# Patient Record
Sex: Female | Born: 1975 | Race: White | Hispanic: No | Smoking: Current every day smoker
Health system: Southern US, Community
[De-identification: ages and names within clinical notes are randomized; demographics above are authoritative.]

## PROBLEM LIST (undated history)

## (undated) HISTORY — PX: TONSILLECTOMY: SUR1361

---

## 2013-04-14 ENCOUNTER — Emergency Department (HOSPITAL_BASED_OUTPATIENT_CLINIC_OR_DEPARTMENT_OTHER)
Admission: EM | Admit: 2013-04-14 | Discharge: 2013-04-14 | Disposition: A | Discharge status: Home / Self Care | Payer: Self-pay | Attending: Emergency Medicine | Admitting: Emergency Medicine

## 2013-04-14 ENCOUNTER — Encounter (HOSPITAL_BASED_OUTPATIENT_CLINIC_OR_DEPARTMENT_OTHER): Payer: Self-pay | Admitting: *Deleted

## 2013-04-14 ENCOUNTER — Emergency Department (HOSPITAL_BASED_OUTPATIENT_CLINIC_OR_DEPARTMENT_OTHER): Payer: Self-pay

## 2013-04-14 DIAGNOSIS — F17200 Nicotine dependence, unspecified, uncomplicated: Secondary | ICD-10-CM | POA: Insufficient documentation

## 2013-04-14 DIAGNOSIS — Y92410 Unspecified street and highway as the place of occurrence of the external cause: Secondary | ICD-10-CM | POA: Insufficient documentation

## 2013-04-14 DIAGNOSIS — S0003XA Contusion of scalp, initial encounter: Secondary | ICD-10-CM | POA: Insufficient documentation

## 2013-04-14 DIAGNOSIS — S0510XA Contusion of eyeball and orbital tissues, unspecified eye, initial encounter: Secondary | ICD-10-CM | POA: Insufficient documentation

## 2013-04-14 DIAGNOSIS — S0993XA Unspecified injury of face, initial encounter: Secondary | ICD-10-CM

## 2013-04-14 DIAGNOSIS — S01511A Laceration without foreign body of lip, initial encounter: Secondary | ICD-10-CM

## 2013-04-14 DIAGNOSIS — S01501A Unspecified open wound of lip, initial encounter: Secondary | ICD-10-CM | POA: Insufficient documentation

## 2013-04-14 DIAGNOSIS — T07XXXA Unspecified multiple injuries, initial encounter: Secondary | ICD-10-CM

## 2013-04-14 DIAGNOSIS — Y9389 Activity, other specified: Secondary | ICD-10-CM | POA: Insufficient documentation

## 2013-04-14 DIAGNOSIS — Z23 Encounter for immunization: Secondary | ICD-10-CM | POA: Insufficient documentation

## 2013-04-14 DIAGNOSIS — S025XXA Fracture of tooth (traumatic), initial encounter for closed fracture: Secondary | ICD-10-CM | POA: Insufficient documentation

## 2013-04-14 MED ORDER — CEPHALEXIN 500 MG PO CAPS: 500.0000 mg | ORAL_CAPSULE | Freq: Four times a day (QID) | ORAL | Status: DC

## 2013-04-14 MED ORDER — HYDROCODONE-ACETAMINOPHEN 5-325 MG PO TABS
2.0000 | ORAL_TABLET | Freq: Once | ORAL | Status: AC
  Administered 2013-04-14: 2 via ORAL
  Filled 2013-04-14: qty 2

## 2013-04-14 MED ORDER — HYDROCODONE-ACETAMINOPHEN 5-325 MG PO TABS: 2.0000 | ORAL_TABLET | ORAL | Status: DC | PRN

## 2013-04-14 MED ORDER — TETANUS-DIPHTH-ACELL PERTUSSIS 5-2.5-18.5 LF-MCG/0.5 IM SUSP
0.5000 mL | Freq: Once | INTRAMUSCULAR | Status: AC
  Administered 2013-04-14: 0.5 mL via INTRAMUSCULAR
  Filled 2013-04-14: qty 0.5
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
## 2013-04-14 NOTE — ED Provider Notes (Signed)
Medical screening examination/treatment/procedure(s) were performed by non-physician practitioner and as supervising physician I was immediately available for consultation/collaboration.   Rogen Porte, MD 04/14/13 2318 
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
## 2013-04-19 ENCOUNTER — Encounter (HOSPITAL_BASED_OUTPATIENT_CLINIC_OR_DEPARTMENT_OTHER): Payer: Self-pay | Admitting: *Deleted

## 2013-04-19 ENCOUNTER — Emergency Department (HOSPITAL_BASED_OUTPATIENT_CLINIC_OR_DEPARTMENT_OTHER)
Admission: EM | Admit: 2013-04-19 | Discharge: 2013-04-19 | Disposition: A | Discharge status: Home / Self Care | Payer: Self-pay | Attending: Emergency Medicine | Admitting: Emergency Medicine

## 2013-04-19 DIAGNOSIS — Z87828 Personal history of other (healed) physical injury and trauma: Secondary | ICD-10-CM | POA: Insufficient documentation

## 2013-04-19 DIAGNOSIS — F0781 Postconcussional syndrome: Secondary | ICD-10-CM | POA: Insufficient documentation

## 2013-04-19 DIAGNOSIS — R110 Nausea: Secondary | ICD-10-CM | POA: Insufficient documentation

## 2013-04-19 DIAGNOSIS — F17200 Nicotine dependence, unspecified, uncomplicated: Secondary | ICD-10-CM | POA: Insufficient documentation

## 2013-04-19 MED ORDER — CYCLOBENZAPRINE HCL 10 MG PO TABS: 10.0000 mg | ORAL_TABLET | Freq: Three times a day (TID) | ORAL | Status: AC | PRN

## 2013-04-19 MED ORDER — OXYCODONE-ACETAMINOPHEN 5-325 MG PO TABS: 1.0000 | ORAL_TABLET | Freq: Four times a day (QID) | ORAL | Status: DC | PRN
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
## 2013-07-29 ENCOUNTER — Encounter (HOSPITAL_BASED_OUTPATIENT_CLINIC_OR_DEPARTMENT_OTHER): Payer: Self-pay | Admitting: Emergency Medicine

## 2013-07-29 ENCOUNTER — Emergency Department (HOSPITAL_BASED_OUTPATIENT_CLINIC_OR_DEPARTMENT_OTHER)
Admission: EM | Admit: 2013-07-29 | Discharge: 2013-07-29 | Disposition: A | Discharge status: Home / Self Care | Payer: No Typology Code available for payment source | Attending: Emergency Medicine | Admitting: Emergency Medicine

## 2013-07-29 DIAGNOSIS — Y9289 Other specified places as the place of occurrence of the external cause: Secondary | ICD-10-CM | POA: Insufficient documentation

## 2013-07-29 DIAGNOSIS — IMO0002 Reserved for concepts with insufficient information to code with codable children: Secondary | ICD-10-CM | POA: Insufficient documentation

## 2013-07-29 DIAGNOSIS — Y9389 Activity, other specified: Secondary | ICD-10-CM | POA: Insufficient documentation

## 2013-07-29 DIAGNOSIS — S0993XA Unspecified injury of face, initial encounter: Secondary | ICD-10-CM | POA: Insufficient documentation

## 2013-07-29 DIAGNOSIS — R51 Headache: Secondary | ICD-10-CM | POA: Insufficient documentation

## 2013-07-29 DIAGNOSIS — R05 Cough: Secondary | ICD-10-CM | POA: Insufficient documentation

## 2013-07-29 DIAGNOSIS — Z79899 Other long term (current) drug therapy: Secondary | ICD-10-CM | POA: Insufficient documentation

## 2013-07-29 DIAGNOSIS — X503XXA Overexertion from repetitive movements, initial encounter: Secondary | ICD-10-CM | POA: Insufficient documentation

## 2013-07-29 DIAGNOSIS — Z792 Long term (current) use of antibiotics: Secondary | ICD-10-CM | POA: Insufficient documentation

## 2013-07-29 DIAGNOSIS — R059 Cough, unspecified: Secondary | ICD-10-CM | POA: Insufficient documentation

## 2013-07-29 DIAGNOSIS — F17200 Nicotine dependence, unspecified, uncomplicated: Secondary | ICD-10-CM | POA: Insufficient documentation

## 2013-07-29 DIAGNOSIS — R110 Nausea: Secondary | ICD-10-CM | POA: Insufficient documentation

## 2013-07-29 DIAGNOSIS — S46911A Strain of unspecified muscle, fascia and tendon at shoulder and upper arm level, right arm, initial encounter: Secondary | ICD-10-CM

## 2013-07-29 MED ORDER — OXYCODONE-ACETAMINOPHEN 5-325 MG PO TABS: 1.0000 | ORAL_TABLET | Freq: Four times a day (QID) | ORAL | Status: AC | PRN

## 2013-07-29 MED ORDER — OXYCODONE-ACETAMINOPHEN 5-325 MG PO TABS
1.0000 | ORAL_TABLET | Freq: Once | ORAL | Status: AC
  Administered 2013-07-29: 1 via ORAL
  Filled 2013-07-29: qty 1

## 2013-07-29 NOTE — ED Notes (Signed)
Pt sts moved a few months ago and has been going through boxes, woke up the next day with right arm pain radiating up into neck and causing head pain as well.

## 2013-07-29 NOTE — ED Provider Notes (Addendum)
CSN: [id number]     Arrival date & time 07/29/13  1704 History  This chart was scribed for Junius Argyle, MD by Bennett Scrape, ED Scribe. This patient was seen in room MHT13/[facility]13 and the patient's care was started at 7:17 PM.    Chief Complaint  Patient presents with  . Shoulder Pain    Patient is a 37 y.o. female presenting with shoulder pain. The history is provided by the patient. No language interpreter was used.  Shoulder Pain This is a new problem. The current episode started more than 2 days ago. The problem occurs constantly. The problem has not changed since onset.Associated symptoms include headaches. Pertinent negatives include no chest pain, no abdominal pain and no shortness of breath. Exacerbated by: lifting the arm and turning the head. Relieved by: protraction and message  Treatments tried: 200 mg ibuprofen. The treatment provided mild relief.   HPI Comments: Katelyn Johnson is a 37 y.o. female who presents to the Emergency Department complaining of 3 days of right arm pain that radiates into the right neck with associated intermittent HA upon waking. She describes the pain as a pulling sensation mainly located around the right shoulder blade. She admits that she moved a few months ago and has been going through boxes the day prior to the onset. She states that the pain is improved mildly with message and protraction and is aggravated with turning the head and with lifting of the right arm.    No past medical history on file. Past Surgical History  Procedure Laterality Date  . Tonsillectomy     No family history on file. History  Substance Use Topics  . Smoking status: Current Every Day Smoker -- 1.00 packs/day    Types: Cigarettes  . Smokeless tobacco: Not on file  . Alcohol Use: Yes     Comment: once in a while  She is a waitress  No OB history provided.   Review of Systems  Constitutional: Negative for fever and fatigue.  HENT: Negative for congestion and  drooling.   Eyes: Negative for pain.  Respiratory: Positive for cough. Negative for shortness of breath.   Cardiovascular: Negative for chest pain.  Gastrointestinal: Positive for nausea. Negative for vomiting, abdominal pain and diarrhea.  Genitourinary: Negative for dysuria and hematuria.  Musculoskeletal: Positive for arthralgias and neck pain. Negative for back pain.  Skin: Negative for color change.  Neurological: Positive for headaches. Negative for dizziness.  Hematological: Negative for adenopathy.  Psychiatric/Behavioral: Negative for behavioral problems.  All other systems reviewed and are negative.    Allergies  Review of patient's allergies indicates no known allergies.  Home Medications   Current Outpatient Rx  Name  Route  Sig  Dispense  Refill  . cephALEXin (KEFLEX) 500 MG capsule   Oral   Take 1 capsule (500 mg total) by mouth 4 (four) times daily.   28 capsule   0   . cyclobenzaprine (FLEXERIL) 10 MG tablet   Oral   Take 1 tablet (10 mg total) by mouth 3 (three) times daily as needed for muscle spasms.   30 tablet   0   . oxyCODONE-acetaminophen (PERCOCET/ROXICET) 5-325 MG per tablet   Oral   Take 1-2 tablets by mouth every 6 (six) hours as needed for pain.   20 tablet   0    Triage Vitals: BP 132/89  Pulse 90  Temp(Src) 99 F (37.2 C) (Oral)  Wt 100 lb (45.36 kg)  BMI 17.72 kg/m2  SpO2 100%  LMP 07/24/2013  Physical Exam  Nursing note and vitals reviewed. Constitutional: She is oriented to person, place, and time. She appears well-developed and well-nourished. No distress.  HENT:  Head: Normocephalic and atraumatic.  Mouth/Throat: Oropharynx is clear and moist.  Eyes: EOM are normal.  Neck: Neck supple. No tracheal deviation present.  Mild TTP of right trapezius. Mild pain with ROM of neck to the left.   Cardiovascular: Normal rate, regular rhythm, normal heart sounds and intact distal pulses.   Pulmonary/Chest: Effort normal. No  respiratory distress.  Musculoskeletal: Normal range of motion.  Normal ROM of right arm. Pain with abduction of right shoulder. Normal sensation. 2+ distal pulses   Neurological: She is alert and oriented to person, place, and time.  Skin: Skin is warm and dry.  Psychiatric: She has a normal mood and affect. Her behavior is normal.    ED Course  Procedures (including critical care time)  DIAGNOSTIC STUDIES: Oxygen Saturation is 100% on room air, normal by my interpretation.    COORDINATION OF CARE: 7:21 PM- Discussed discharge plan which includes ice, continued Ibuprofen, heating pad and Percocet PRN with pt and pt agreed to plan. Also advised pt to follow up as needed and pt agreed. Addressed symptoms to return for with pt. Will provide a work note for tonight with instructions for no heavy lifting.   Labs Review Labs Reviewed - No data to display Imaging Review No results found.  EKG Interpretation   None       MDM   1. Right shoulder strain, initial encounter    7:27 PM 37 y.o. female who presents with right shoulder pain for several days after lifting boxes. Her exam is consistent with a right shoulder strain. Will recommend rest,NSAIDs, and will provide a prescription for stronger pain medicine for several days.   Discharge Medication List as of 07/29/2013  7:25 PM    START taking these medications   Details  !! oxyCODONE-acetaminophen (PERCOCET) 5-325 MG per tablet Take 1 tablet by mouth every 6 (six) hours as needed for pain., Starting 07/29/2013, Until Discontinued, Print     !! - Potential duplicate medications found. Please discuss with provider.        I personally performed the services described in this documentation, which was scribed in my presence. The recorded information has been reviewed and is accurate.    Junius Argyle, MD 07/30/13 1037  Junius Argyle, MD 07/30/13 1037
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
## 2014-01-05 ENCOUNTER — Encounter (HOSPITAL_BASED_OUTPATIENT_CLINIC_OR_DEPARTMENT_OTHER): Payer: Self-pay | Admitting: Emergency Medicine

## 2014-01-05 ENCOUNTER — Emergency Department (HOSPITAL_BASED_OUTPATIENT_CLINIC_OR_DEPARTMENT_OTHER)
Admission: EM | Admit: 2014-01-05 | Discharge: 2014-01-05 | Disposition: A | Discharge status: Home / Self Care | Payer: No Typology Code available for payment source | Attending: Emergency Medicine | Admitting: Emergency Medicine

## 2014-01-05 DIAGNOSIS — J209 Acute bronchitis, unspecified: Secondary | ICD-10-CM | POA: Insufficient documentation

## 2014-01-05 DIAGNOSIS — J40 Bronchitis, not specified as acute or chronic: Secondary | ICD-10-CM

## 2014-01-05 DIAGNOSIS — H9209 Otalgia, unspecified ear: Secondary | ICD-10-CM | POA: Insufficient documentation

## 2014-01-05 DIAGNOSIS — F17200 Nicotine dependence, unspecified, uncomplicated: Secondary | ICD-10-CM | POA: Insufficient documentation

## 2014-01-05 MED ORDER — BENZONATATE 200 MG PO CAPS: 200.0000 mg | ORAL_CAPSULE | Freq: Three times a day (TID) | ORAL | Status: AC | PRN

## 2014-01-05 MED ORDER — AMOXICILLIN 500 MG PO CAPS: 1000.0000 mg | ORAL_CAPSULE | Freq: Two times a day (BID) | ORAL | Status: AC

## 2014-01-05 NOTE — Discharge Instructions (Signed)
Bronchitis Bronchitis is inflammation of the airways that extend from the windpipe into the lungs (bronchi). The inflammation often causes mucus to develop, which leads to a cough. If the inflammation becomes severe, it may cause shortness of breath. CAUSES  Bronchitis may be caused by:   Viral infections.   Bacteria.   Cigarette smoke.   Allergens, pollutants, and other irritants.  SIGNS AND SYMPTOMS  The most common symptom of bronchitis is a frequent cough that produces mucus. Other symptoms include:  Fever.   Body aches.   Chest congestion.   Chills.   Shortness of breath.   Sore throat.  DIAGNOSIS  Bronchitis is usually diagnosed through a medical history and physical exam. Tests, such as chest X-rays, are sometimes done to rule out other conditions.  TREATMENT  You may need to avoid contact with whatever caused the problem (smoking, for example). Medicines are sometimes needed. These may include:  Antibiotics. These may be prescribed if the condition is caused by bacteria.  Cough suppressants. These may be prescribed for relief of cough symptoms.   Inhaled medicines. These may be prescribed to help open your airways and make it easier for you to breathe.   Steroid medicines. These may be prescribed for those with recurrent (chronic) bronchitis. HOME CARE INSTRUCTIONS  Get plenty of rest.   Drink enough fluids to keep your urine clear or pale yellow (unless you have a medical condition that requires fluid restriction). Increasing fluids may help thin your secretions and will prevent dehydration.   Only take over-the-counter or prescription medicines as directed by your health care provider.  Only take antibiotics as directed. Make sure you finish them even if you start to feel better.  Avoid secondhand smoke, irritating chemicals, and strong fumes. These will make bronchitis worse. If you are a smoker, quit smoking. Consider using nicotine gum or  skin patches to help control withdrawal symptoms. Quitting smoking will help your lungs heal faster.   Put a cool-mist humidifier in your bedroom at night to moisten the air. This may help loosen mucus. Change the water in the humidifier daily. You can also run the hot water in your shower and sit in the bathroom with the door closed for 5 10 minutes.   Follow up with your health care provider as directed.   Wash your hands frequently to avoid catching bronchitis again or spreading an infection to others.  SEEK MEDICAL CARE IF: Your symptoms do not improve after 1 week of treatment.  SEEK IMMEDIATE MEDICAL CARE IF:  Your fever increases.  You have chills.   You have chest pain.   You have worsening shortness of breath.   You have bloody sputum.  You faint.  You have lightheadedness.  You have a severe headache.   You vomit repeatedly. MAKE SURE YOU:   Understand these instructions.  Will watch your condition.  Will get help right away if you are not doing well or get worse. Document Released: 09/14/2005 Document Revised: 07/05/2013 Document Reviewed: 05/09/2013 Exit[employer] Patient Information 2014 [employer]. Smoking Cessation Quitting smoking is important to your health and has many advantages. However, it is not always easy to quit since nicotine is a very addictive drug. Often times, people try 3 times or more before being able to quit. This document explains the best ways for you to prepare to quit smoking. Quitting takes hard work and a lot of effort, but you can do it. ADVANTAGES OF QUITTING SMOKING  You will live longer,   feel better, and live better.  Your body will feel the impact of quitting smoking almost immediately.  Within 20 minutes, blood pressure decreases. Your pulse returns to its normal level.  After 8 hours, carbon monoxide levels in the blood return to normal. Your oxygen level increases.  After 24 hours, the chance of having a heart  attack starts to decrease. Your breath, hair, and body stop smelling like smoke.  After 48 hours, damaged nerve endings begin to recover. Your sense of taste and smell improve.  After 72 hours, the body is virtually free of nicotine. Your bronchial tubes relax and breathing becomes easier.  After 2 to 12 weeks, lungs can hold more air. Exercise becomes easier and circulation improves.  The risk of having a heart attack, stroke, cancer, or lung disease is greatly reduced.  After 1 year, the risk of coronary heart disease is cut in half.  After 5 years, the risk of stroke falls to the same as a nonsmoker.  After 10 years, the risk of lung cancer is cut in half and the risk of other cancers decreases significantly.  After 15 years, the risk of coronary heart disease drops, usually to the level of a nonsmoker.  If you are pregnant, quitting smoking will improve your chances of having a healthy baby.  The people you live with, especially any children, will be healthier.  You will have extra money to spend on things other than cigarettes. QUESTIONS TO THINK ABOUT BEFORE ATTEMPTING TO QUIT You may want to talk about your answers with your caregiver.  Why do you want to quit?  If you tried to quit in the past, what helped and what did not?  What will be the most difficult situations for you after you quit? How will you plan to handle them?  Who can help you through the tough times? Your family? Friends? A caregiver?  What pleasures do you get from smoking? What ways can you still get pleasure if you quit? Here are some questions to ask your caregiver:  How can you help me to be successful at quitting?  What medicine do you think would be best for me and how should I take it?  What should I do if I need more help?  What is smoking withdrawal like? How can I get information on withdrawal? GET READY  Set a quit date.  Change your environment by getting rid of all cigarettes,  ashtrays, matches, and lighters in your home, car, or work. Do not let people smoke in your home.  Review your past attempts to quit. Think about what worked and what did not. GET SUPPORT AND ENCOURAGEMENT You have a better chance of being successful if you have help. You can get support in many ways.  Tell your family, friends, and co-workers that you are going to quit and need their support. Ask them not to smoke around you.  Get individual, group, or telephone counseling and support. Programs are available at local hospitals and health centers. Call your local health department for information about programs in your area.  Spiritual beliefs and practices may help some smokers quit.  Download a "quit meter" on your computer to keep track of quit statistics, such as how long you have gone without smoking, cigarettes not smoked, and money saved.  Get a self-help book about quitting smoking and staying off of tobacco. LEARN NEW SKILLS AND BEHAVIORS  Distract yourself from urges to smoke. Talk to someone, go for a   walk, or occupy your time with a task.  Change your normal routine. Take a different route to work. Drink tea instead of coffee. Eat breakfast in a different place.  Reduce your stress. Take a hot bath, exercise, or read a book.  Plan something enjoyable to do every day. Reward yourself for not smoking.  Explore interactive web-based programs that specialize in helping you quit. GET MEDICINE AND USE IT CORRECTLY Medicines can help you stop smoking and decrease the urge to smoke. Combining medicine with the above behavioral methods and support can greatly increase your chances of successfully quitting smoking.  Nicotine replacement therapy helps deliver nicotine to your body without the negative effects and risks of smoking. Nicotine replacement therapy includes nicotine gum, lozenges, inhalers, nasal sprays, and skin patches. Some may be available over-the-counter and others  require a prescription.  Antidepressant medicine helps people abstain from smoking, but how this works is unknown. This medicine is available by prescription.  Nicotinic receptor partial agonist medicine simulates the effect of nicotine in your brain. This medicine is available by prescription. Ask your caregiver for advice about which medicines to use and how to use them based on your health history. Your caregiver will tell you what side effects to look out for if you choose to be on a medicine or therapy. Carefully read the information on the package. Do not use any other product containing nicotine while using a nicotine replacement product.  RELAPSE OR DIFFICULT SITUATIONS Most relapses occur within the first 3 months after quitting. Do not be discouraged if you start smoking again. Remember, most people try several times before finally quitting. You may have symptoms of withdrawal because your body is used to nicotine. You may crave cigarettes, be irritable, feel very hungry, cough often, get headaches, or have difficulty concentrating. The withdrawal symptoms are only temporary. They are strongest when you first quit, but they will go away within 10 14 days. To reduce the chances of relapse, try to:  Avoid drinking alcohol. Drinking lowers your chances of successfully quitting.  Reduce the amount of caffeine you consume. Once you quit smoking, the amount of caffeine in your body increases and can give you symptoms, such as a rapid heartbeat, sweating, and anxiety.  Avoid smokers because they can make you want to smoke.  Do not let weight gain distract you. Many smokers will gain weight when they quit, usually less than 10 pounds. Eat a healthy diet and stay active. You can always lose the weight gained after you quit.  Find ways to improve your mood other than smoking. FOR MORE INFORMATION  www.smokefree.gov  Document Released: 09/08/2001 Document Revised: 03/15/2012 Document Reviewed:  12/24/2011 Exit[employer] Patient Information 2014 [employer].  

## 2014-01-05 NOTE — ED Provider Notes (Signed)
CSN: [id number]     Arrival date & time 01/05/14  1327 History   First MD Initiated Contact with Patient 01/05/14 1431     Chief Complaint  Patient presents with  . URI     (Consider location/radiation/quality/duration/timing/severity/associated sxs/prior Treatment) Patient is a 38 y.o. female presenting with URI. The history is provided by the patient. No language interpreter was used.  URI Presenting symptoms: cough, ear pain, facial pain, rhinorrhea and sore throat   Presenting symptoms: no fever   Severity:  Moderate Associated symptoms comment:  Cough for the past 3 weeks, left sore throat for the past 3 days. No known fever. She reports increasing congestion, muffled hearing in left ear.    History reviewed. No pertinent past medical history. Past Surgical History  Procedure Laterality Date  . Tonsillectomy     No family history on file. History  Substance Use Topics  . Smoking status: Current Every Day Smoker -- 1.00 packs/day    Types: Cigarettes  . Smokeless tobacco: Not on file  . Alcohol Use: Yes     Comment: once in a while   OB History   Grav Para Term Preterm Abortions TAB SAB Ect Mult Living                 Review of Systems  Constitutional: Negative for fever.  HENT: Positive for ear pain, rhinorrhea and sore throat.   Respiratory: Positive for cough.   Gastrointestinal: Negative for nausea.      Allergies  Review of patient's allergies indicates no known allergies.  Home Medications   Current Outpatient Rx  Name  Route  Sig  Dispense  Refill  . cephALEXin (KEFLEX) 500 MG capsule   Oral   Take 1 capsule (500 mg total) by mouth 4 (four) times daily.   28 capsule   0   . cyclobenzaprine (FLEXERIL) 10 MG tablet   Oral   Take 1 tablet (10 mg total) by mouth 3 (three) times daily as needed for muscle spasms.   30 tablet   0   . oxyCODONE-acetaminophen (PERCOCET) 5-325 MG per tablet   Oral   Take 1 tablet by mouth every 6 (six) hours as  needed for pain.   10 tablet   0   . oxyCODONE-acetaminophen (PERCOCET/ROXICET) 5-325 MG per tablet   Oral   Take 1-2 tablets by mouth every 6 (six) hours as needed for pain.   20 tablet   0    BP 124/75  Pulse 92  Temp(Src) 98.7 F (37.1 C) (Oral)  Resp 20  Ht 5\' 3"  (1.6 m)  Wt 95 lb (43.092 kg)  BMI 16.83 kg/m2  SpO2 100%  LMP 12/12/2013 Physical Exam  Constitutional: She is oriented to person, place, and time. She appears well-developed and well-nourished. No distress.  Eyes: Conjunctivae are normal.  Neck: Normal range of motion.  Cardiovascular: Normal rate.   No murmur heard. Pulmonary/Chest: Effort normal. She has no wheezes. She has no rales.  Abdominal: Soft. There is no tenderness.  Neurological: She is alert and oriented to person, place, and time.  Skin: Skin is warm and dry.  Psychiatric: She has a normal mood and affect.    ED Course  Procedures (including critical care time) Labs Review Labs Reviewed - No data to display Imaging Review No results found.   EKG Interpretation None      MDM   Final diagnoses:  None    1. Bronchitis  Will treat with abx  secondary to duration of symptoms of cough. OTC medications recommended.     Arnoldo Hooker, PA-C 01/05/14 1503

## 2014-01-05 NOTE — ED Notes (Signed)
Pain in her left ear into her neck and throat. Cough. Pain with swallowing. Hx of seasonal allergies. Has been waking with discharge from her eyes.

## 2014-01-08 NOTE — ED Provider Notes (Signed)
Medical screening examination/treatment/procedure(s) were performed by non-physician practitioner and as supervising physician I was immediately available for consultation/collaboration.   EKG Interpretation None        Charles B. Bernette MayersSheldon, MD 01/08/14 [id number]

## 2014-06-26 ENCOUNTER — Encounter (HOSPITAL_BASED_OUTPATIENT_CLINIC_OR_DEPARTMENT_OTHER): Payer: Self-pay | Admitting: Emergency Medicine

## 2014-06-26 ENCOUNTER — Emergency Department (HOSPITAL_BASED_OUTPATIENT_CLINIC_OR_DEPARTMENT_OTHER): Payer: Self-pay

## 2014-06-26 ENCOUNTER — Emergency Department (HOSPITAL_BASED_OUTPATIENT_CLINIC_OR_DEPARTMENT_OTHER)
Admission: EM | Admit: 2014-06-26 | Discharge: 2014-06-26 | Disposition: A | Discharge status: Home / Self Care | Payer: Self-pay | Attending: Emergency Medicine | Admitting: Emergency Medicine

## 2014-06-26 DIAGNOSIS — W108XXA Fall (on) (from) other stairs and steps, initial encounter: Secondary | ICD-10-CM | POA: Insufficient documentation

## 2014-06-26 DIAGNOSIS — S99919A Unspecified injury of unspecified ankle, initial encounter: Secondary | ICD-10-CM

## 2014-06-26 DIAGNOSIS — Y9389 Activity, other specified: Secondary | ICD-10-CM | POA: Insufficient documentation

## 2014-06-26 DIAGNOSIS — S93402A Sprain of unspecified ligament of left ankle, initial encounter: Secondary | ICD-10-CM

## 2014-06-26 DIAGNOSIS — F17200 Nicotine dependence, unspecified, uncomplicated: Secondary | ICD-10-CM | POA: Insufficient documentation

## 2014-06-26 DIAGNOSIS — W010XXA Fall on same level from slipping, tripping and stumbling without subsequent striking against object, initial encounter: Secondary | ICD-10-CM | POA: Insufficient documentation

## 2014-06-26 DIAGNOSIS — S99929A Unspecified injury of unspecified foot, initial encounter: Secondary | ICD-10-CM

## 2014-06-26 DIAGNOSIS — Y9289 Other specified places as the place of occurrence of the external cause: Secondary | ICD-10-CM | POA: Insufficient documentation

## 2014-06-26 DIAGNOSIS — S8990XA Unspecified injury of unspecified lower leg, initial encounter: Secondary | ICD-10-CM | POA: Insufficient documentation

## 2014-06-26 DIAGNOSIS — Z79899 Other long term (current) drug therapy: Secondary | ICD-10-CM | POA: Insufficient documentation

## 2014-06-26 DIAGNOSIS — S93409A Sprain of unspecified ligament of unspecified ankle, initial encounter: Secondary | ICD-10-CM | POA: Insufficient documentation

## 2014-06-26 DIAGNOSIS — Z792 Long term (current) use of antibiotics: Secondary | ICD-10-CM | POA: Insufficient documentation

## 2014-06-26 MED ORDER — OXYCODONE-ACETAMINOPHEN 5-325 MG PO TABS: 1.0000 | ORAL_TABLET | ORAL | Status: AC | PRN

## 2014-06-26 NOTE — ED Notes (Signed)
Patient refused crutches.

## 2014-06-26 NOTE — ED Provider Notes (Signed)
CSN: [id number]     Arrival date & time 06/26/14  1819 History   First MD Initiated Contact with Patient 06/26/14 1900     Chief Complaint  Patient presents with  . Ankle Injury     (Consider location/radiation/quality/duration/timing/severity/associated sxs/prior Treatment) Patient is a 38 y.o. female presenting with lower extremity injury. The history is provided by the patient.  Ankle Injury This is a new problem. The current episode started yesterday. The problem has been gradually worsening. The symptoms are aggravated by standing and walking. Katelyn Johnson has tried NSAIDs, acetaminophen and ice for the symptoms. The treatment provided mild relief.   Katelyn Johnson is a 38 y.o. female who presents to the ED with left ankle pain that started last night after Katelyn Johnson slipped on wet steps and twisted her ankle. Katelyn Johnson complains of pain and swelling to the lateral aspect. The pain is severe. Katelyn Johnson applied ice last night and took ibuprofen without relief. Today the swelling and pain has increased. Katelyn Johnson denies any other injuries.  History reviewed. No pertinent past medical history. Past Surgical History  Procedure Laterality Date  . Tonsillectomy     No family history on file. History  Substance Use Topics  . Smoking status: Current Every Day Smoker -- 1.00 packs/day    Types: Cigarettes  . Smokeless tobacco: Not on file  . Alcohol Use: Yes     Comment: once in a while   OB History   Grav Para Term Preterm Abortions TAB SAB Ect Mult Living                 Review of Systems Negative except as stated in HPI   Allergies  Review of patient's allergies indicates no known allergies.  Home Medications   Prior to Admission medications   Medication Sig Start Date End Date Taking? Authorizing Provider  amoxicillin (AMOXIL) 500 MG capsule Take 2 capsules (1,000 mg total) by mouth 2 (two) times daily. 01/05/14   Shari A Upstill, PA-C  benzonatate (TESSALON) 200 MG capsule Take 1 capsule (200 mg total)  by mouth 3 (three) times daily as needed for cough. 01/05/14   Shari A Upstill, PA-C  cephALEXin (KEFLEX) 500 MG capsule Take 1 capsule (500 mg total) by mouth 4 (four) times daily. 04/14/13   Elson Areas, PA-C  cyclobenzaprine (FLEXERIL) 10 MG tablet Take 1 tablet (10 mg total) by mouth 3 (three) times daily as needed for muscle spasms. 04/19/13   Charles B. Bernette Mayers, MD  oxyCODONE-acetaminophen (PERCOCET) 5-325 MG per tablet Take 1 tablet by mouth every 6 (six) hours as needed for pain. 07/29/13   Purvis Sheffield, MD  oxyCODONE-acetaminophen (PERCOCET/ROXICET) 5-325 MG per tablet Take 1-2 tablets by mouth every 6 (six) hours as needed for pain. 04/19/13   Charles B. Bernette Mayers, MD   BP 126/89  Pulse 83  Temp(Src) 98.3 F (36.8 C) (Oral)  Resp 20  Wt 95 lb (43.092 kg)  SpO2 99%  LMP 06/21/2014 Physical Exam  Nursing note and vitals reviewed. Constitutional: Katelyn Johnson is oriented to person, place, and time. Katelyn Johnson appears well-developed and well-nourished.  HENT:  Head: Normocephalic and atraumatic.  Eyes: EOM are normal.  Neck: Neck supple.  Cardiovascular: Normal rate.   Pulmonary/Chest: Effort normal.  Musculoskeletal:       Left ankle: Katelyn Johnson exhibits decreased range of motion (due to pain), swelling and ecchymosis. Katelyn Johnson exhibits no laceration and normal pulse. Tenderness. Lateral malleolus tenderness found. Achilles tendon normal.       Feet:  Neurological: Katelyn Johnson is alert and oriented to person, place, and time. Katelyn Johnson has normal strength. No cranial nerve deficit or sensory deficit.  Pedal pulses equal, adequate circulation, good touch sensation   Skin: Skin is warm and dry.  Psychiatric: Katelyn Johnson has a normal mood and affect. Her behavior is normal.    ED Course  Procedures (including critical care time) Labs Review Labs Reviewed - No data to display  Imaging Review Dg Ankle Complete Left  06/26/2014   CLINICAL DATA:  Trauma and pain.  EXAM: LEFT ANKLE COMPLETE - 3+ VIEW  COMPARISON:  None.   FINDINGS: Moderate lateral malleolar soft tissue swelling. No acute fracture or dislocation. Talar dome intact. Base of fifth metatarsal suboptimally evaluated.  IMPRESSION: Lateral soft tissue swelling only.   Electronically Signed   By: Jeronimo GreavesKyle  Talbot M.D.   On: 06/26/2014 18:38     MDM  38 y.o. female with left ankle pain and swelling s/p injury last pm. Placed in ASO, Crutches, pain management, ice and elevation. Stable for discharge without neurovascular deficits. Katelyn Johnson will follow up with Dr. Pearletha ForgeHudnall. Katelyn Johnson will return here as needed for any problems.      Janne NapoleonHope M Neese, [place]NP 06/26/14 2012

## 2014-06-26 NOTE — ED Notes (Addendum)
Slipped on wet ground last night. Injury to her left ankle. Swelling and tissue redness noted.

## 2014-06-26 NOTE — ED Notes (Signed)
Pt. Did not want crutches so none given

## 2014-06-26 NOTE — Discharge Instructions (Signed)

## 2014-06-27 NOTE — ED Provider Notes (Signed)
Medical screening examination/treatment/procedure(s) were performed by non-physician practitioner and as supervising physician I was immediately available for consultation/collaboration.  Claire Bridge T Tailer Volkert, MD 06/27/14 0001 

## 2015-03-01 IMAGING — CR DG ANKLE COMPLETE 3+V*L*
3 series · 3 of 3 positions shown · non-contrast
Comparison: None.

CLINICAL DATA: Trauma and pain.

EXAM:
LEFT ANKLE COMPLETE - 3+ VIEW

[t ankle joint ap left]
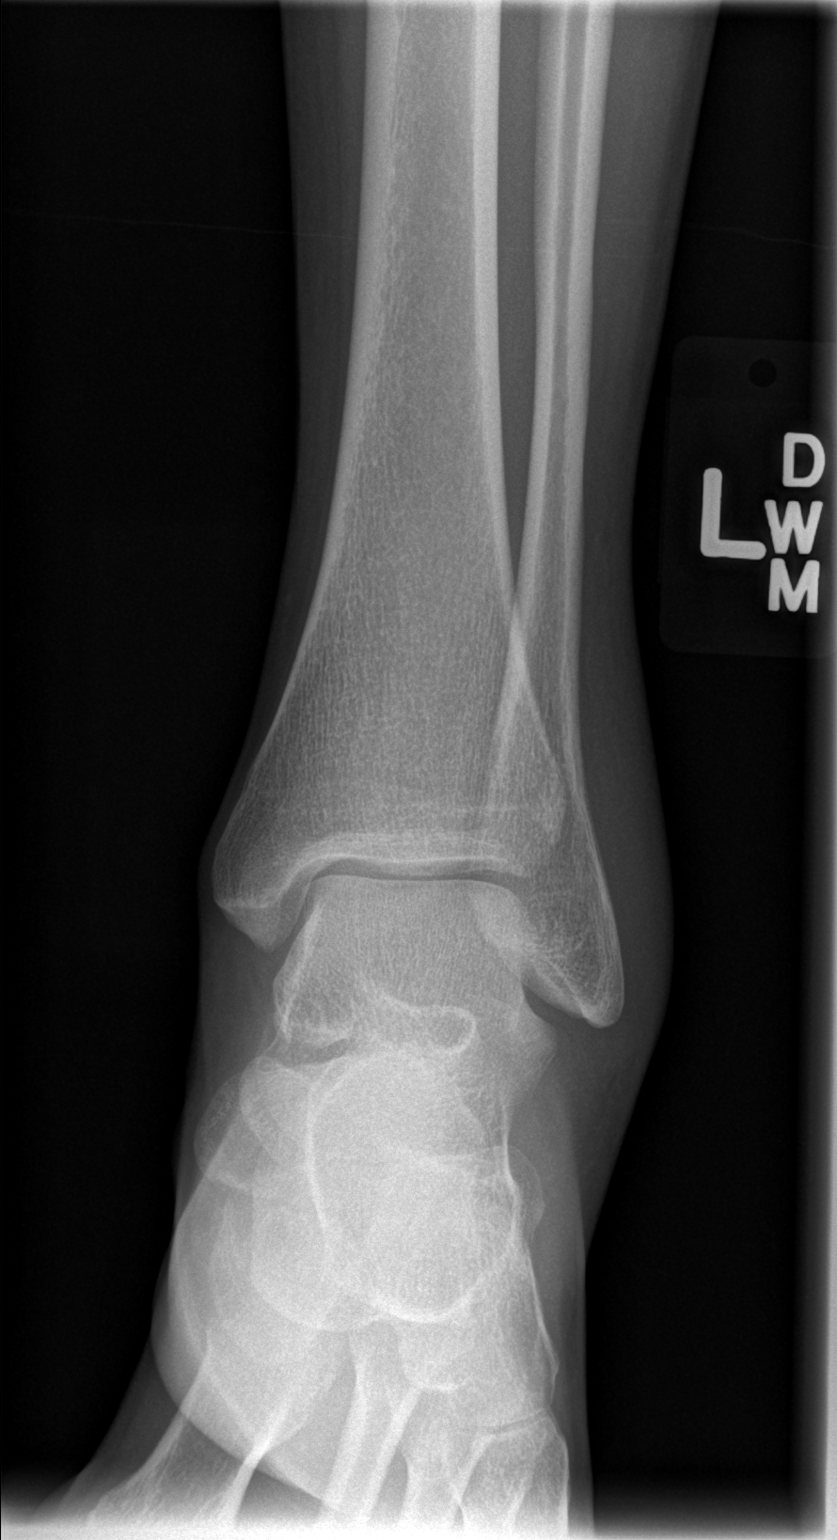

[t ankle joint oblique left]
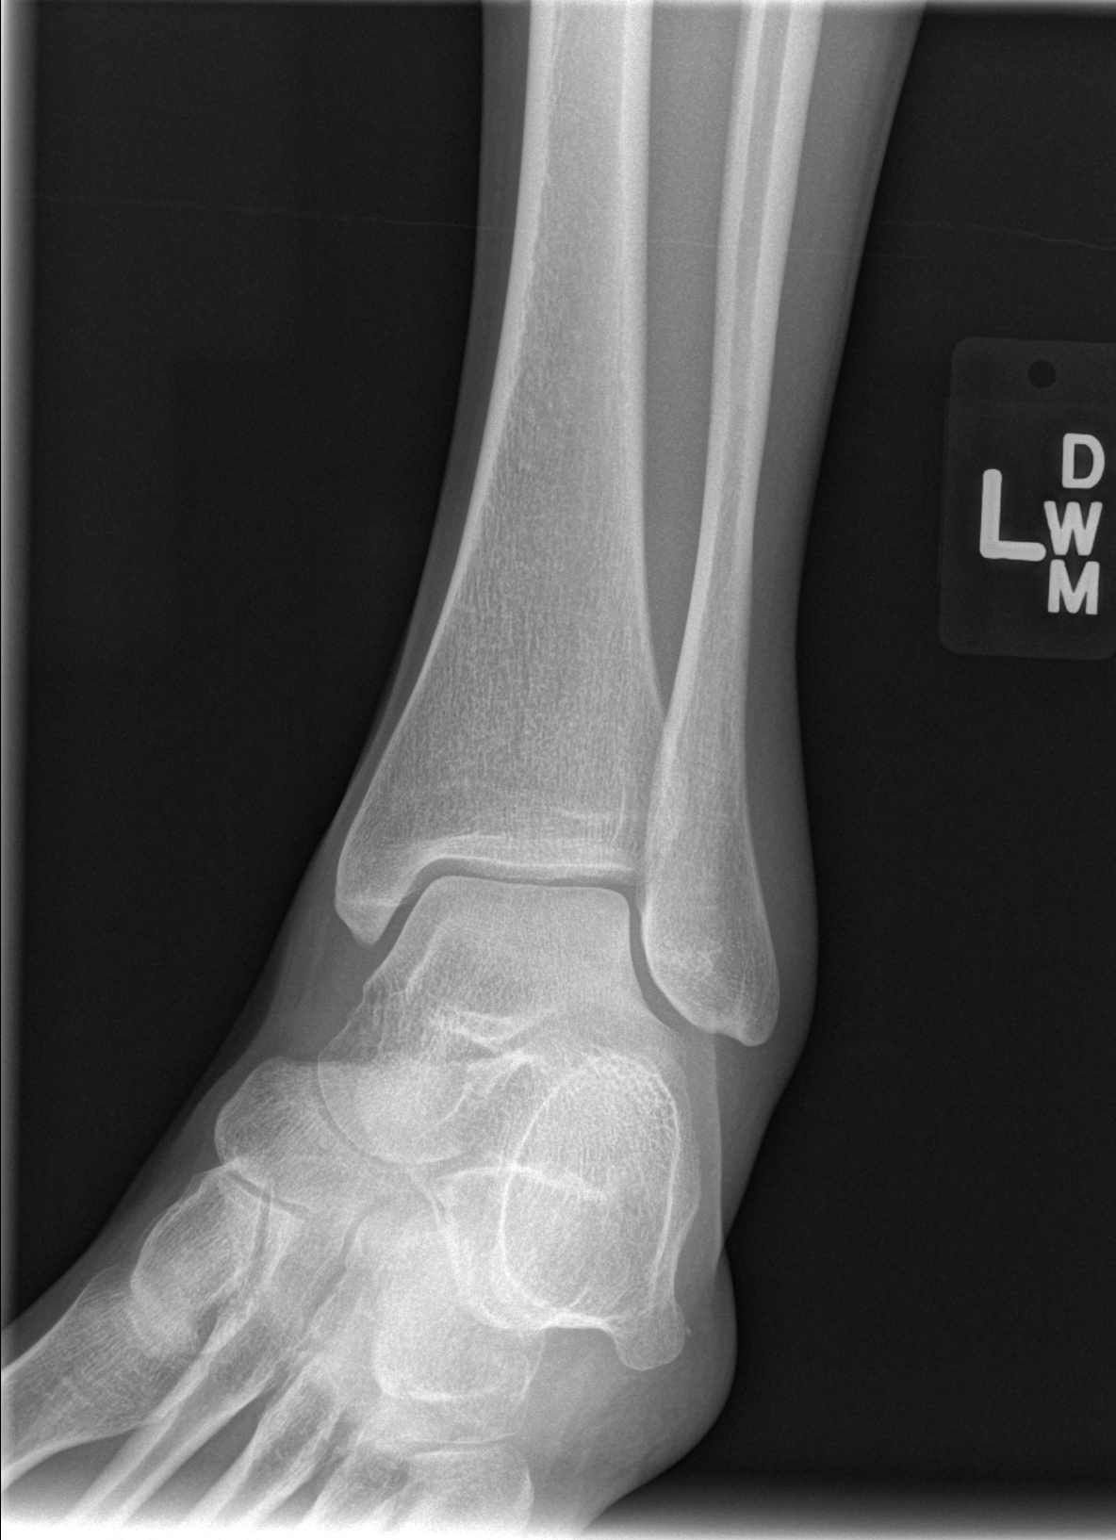

[t ankle joint lat left]
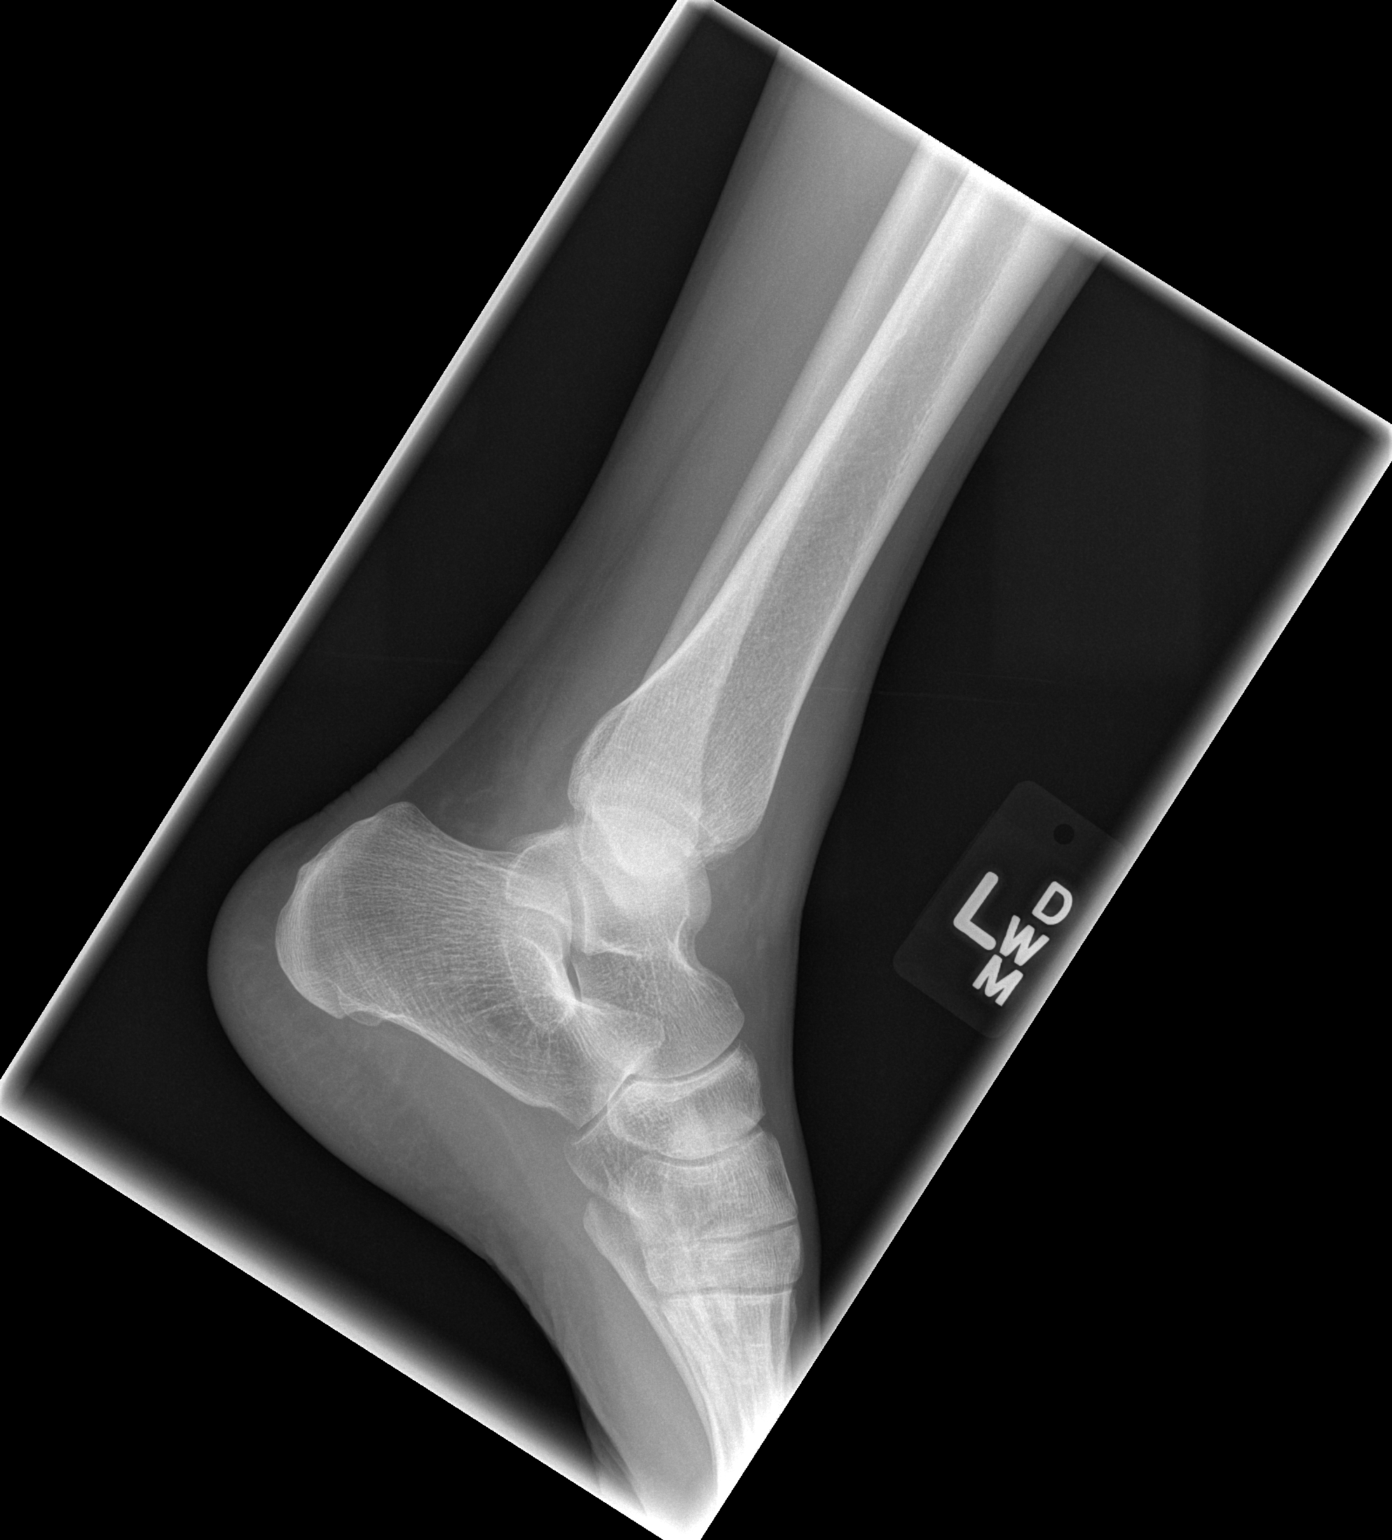

[3 of 3 positions shown; findings below may reference images not displayed]

FINDINGS: Moderate lateral malleolar soft tissue swelling. No acute fracture
or dislocation. Talar dome intact. Base of fifth metatarsal
suboptimally evaluated.
IMPRESSION: Lateral soft tissue swelling only.

## 2016-02-11 ENCOUNTER — Emergency Department (HOSPITAL_BASED_OUTPATIENT_CLINIC_OR_DEPARTMENT_OTHER)
Admission: EM | Admit: 2016-02-11 | Discharge: 2016-02-11 | Disposition: A | Discharge status: Home / Self Care | Payer: No Typology Code available for payment source | Attending: Emergency Medicine | Admitting: Emergency Medicine

## 2016-02-11 ENCOUNTER — Encounter (HOSPITAL_BASED_OUTPATIENT_CLINIC_OR_DEPARTMENT_OTHER): Payer: Self-pay | Admitting: Emergency Medicine

## 2016-02-11 DIAGNOSIS — T7840XA Allergy, unspecified, initial encounter: Secondary | ICD-10-CM

## 2016-02-11 DIAGNOSIS — F17210 Nicotine dependence, cigarettes, uncomplicated: Secondary | ICD-10-CM | POA: Insufficient documentation

## 2016-02-11 DIAGNOSIS — T370X5A Adverse effect of sulfonamides, initial encounter: Secondary | ICD-10-CM | POA: Insufficient documentation

## 2016-02-11 DIAGNOSIS — L299 Pruritus, unspecified: Secondary | ICD-10-CM | POA: Insufficient documentation

## 2016-02-11 MED ORDER — HYDROXYZINE HCL 25 MG PO TABS: 25.0000 mg | ORAL_TABLET | Freq: Once | ORAL | Status: AC

## 2016-02-11 MED ORDER — HYDROXYZINE HCL 25 MG PO TABS
25.0000 mg | ORAL_TABLET | Freq: Once | ORAL | Status: AC
  Administered 2016-02-11: 25 mg via ORAL
  Filled 2016-02-11: qty 1

## 2016-02-11 MED FILL — hydrOXYzine HCL 25 MG TABS: 25 | 10 days supply | Qty: 30 | Fill #0

## 2016-02-11 NOTE — ED Notes (Addendum)
Pt had taken antibiotic for tooth pain (sulfamethoxazole/trimethoprim 800 mg / 160 mg).  Pt took medication yesterday at 5 pm, started to have generalized itching one hour post.  No rash noted but pt is scratching hands,  Noted redness on hands.  Pt also c/o joint pain.

## 2016-02-11 NOTE — ED Notes (Addendum)
Pt states she needs a note stating she was here, to excuse her for court, according to her lawyer.

## 2016-02-11 NOTE — ED Provider Notes (Signed)
CSN: [id number]     Arrival date & time 02/11/16  1105 History   First MD Initiated Contact with Patient 02/11/16 1111     Chief Complaint  Patient presents with  . Medication Reaction     (Consider location/radiation/quality/duration/timing/severity/associated sxs/prior Treatment) Patient is a 40 y.o. female presenting with allergic reaction. The history is provided by the patient.  Allergic Reaction Presenting symptoms: itching   Presenting symptoms: no difficulty breathing, no rash and no wheezing   Severity:  Moderate Prior allergic episodes:  No prior episodes Context: medications (Bactrim from a friend)   Relieved by:  Nothing Worsened by:  Nothing tried Ineffective treatments:  Antihistamines (benadryl)   History reviewed. No pertinent past medical history. Past Surgical History  Procedure Laterality Date  . Tonsillectomy     History reviewed. No pertinent family history. Social History  Substance Use Topics  . Smoking status: Current Every Day Smoker -- 1.00 packs/day    Types: Cigarettes  . Smokeless tobacco: None  . Alcohol Use: Yes     Comment: once in a while   OB History    No data available     Review of Systems  Constitutional: Negative for fever and diaphoresis.  Respiratory: Negative for chest tightness, wheezing and stridor.   Cardiovascular: Negative for chest pain.  Gastrointestinal: Positive for nausea. Negative for vomiting, abdominal pain, diarrhea and constipation.  Skin: Positive for itching. Negative for rash.       Pruritis  Neurological: Negative for dizziness, syncope, speech difficulty and numbness.  All other systems reviewed and are negative.     Allergies  Sulfa antibiotics  Home Medications   Prior to Admission medications   Medication Sig Start Date End Date Taking? Authorizing Provider  amoxicillin (AMOXIL) 500 MG capsule Take 2 capsules (1,000 mg total) by mouth 2 (two) times daily. 01/05/14   Elpidio AnisShari Upstill, PA-C   benzonatate (TESSALON) 200 MG capsule Take 1 capsule (200 mg total) by mouth 3 (three) times daily as needed for cough. 01/05/14   Elpidio AnisShari Upstill, PA-C  cephALEXin (KEFLEX) 500 MG capsule Take 1 capsule (500 mg total) by mouth 4 (four) times daily. 04/14/13   Elson AreasLeslie K Sofia, PA-C  cyclobenzaprine (FLEXERIL) 10 MG tablet Take 1 tablet (10 mg total) by mouth 3 (three) times daily as needed for muscle spasms. 04/19/13   Susy Frizzleharles Sheldon, MD  oxyCODONE-acetaminophen (PERCOCET) 5-325 MG per tablet Take 1 tablet by mouth every 6 (six) hours as needed for pain. 07/29/13   Purvis SheffieldForrest Harrison, MD  oxyCODONE-acetaminophen (ROXICET) 5-325 MG per tablet Take 1 tablet by mouth every 4 (four) hours as needed for severe pain. 06/26/14   Hope Orlene OchM Neese, NP   BP 109/79 mmHg  Pulse 107  Temp(Src) 98 F (36.7 C) (Oral)  Resp 18  Ht 5\' 2"  (1.575 m)  Wt 45.36 kg  BMI 18.29 kg/m2  SpO2 100%  LMP 01/18/2016 Physical Exam  Constitutional: She is oriented to person, place, and time. She appears well-developed and well-nourished.  HENT:  Head: Normocephalic and atraumatic.  Mouth/Throat: Oropharynx is clear and moist.  No uvula, tongue or lip swelling  Eyes: Conjunctivae and EOM are normal. Pupils are equal, round, and reactive to light.  Neck: Normal range of motion.  Cardiovascular: Normal rate and regular rhythm.   Pulmonary/Chest: Effort normal and breath sounds normal. No respiratory distress. She has no wheezes. She has no rales. She exhibits no tenderness.  Abdominal: Soft. She exhibits no distension. There is no tenderness. There  is no rebound and no guarding.  Musculoskeletal: Normal range of motion. She exhibits no edema or tenderness.  Lymphadenopathy:    She has no cervical adenopathy.  Neurological: She is alert and oriented to person, place, and time.  Skin: Skin is warm and dry.    ED Course  Procedures (including critical care time) Labs Review Labs Reviewed - No data to display  Imaging  Review No results found. I have personally reviewed and evaluated these images and lab results as part of my medical decision-making.   EKG Interpretation None      MDM   Final diagnoses:  Pruritus  Allergic reaction, initial encounter   Patient with mild allergic reaction to Bactrim. No evidence of anaphylaxis. Pruritus improved with hydroxyzine PO. No red flags on exam. Will discharge with hydroxyzine  q6hrs prn itching. Red flags discussed for return to ED, including worsening itching, wheezing, swelling, worsening GI symptoms. Patient understands and agrees with plan. Stable for discharge home.    Narda Bonds, MD 02/11/16 1416  Geoffery Lyons, MD 02/12/16 2322

## 2016-02-11 NOTE — Discharge Instructions (Signed)
We evaluated you for your itching. This is likely caused by your antibiotic use of Bactrim (this is a sulfa antibiotic). Your symptoms improved with hydroxyzine. Please continue hydroxyzine as needed for itching. If symptoms worsen, please return for reevaluation.

## 2022-04-22 ENCOUNTER — Other Ambulatory Visit: Payer: Self-pay

## 2022-04-22 ENCOUNTER — Encounter (HOSPITAL_COMMUNITY): Payer: Self-pay | Admitting: *Deleted

## 2022-04-22 ENCOUNTER — Emergency Department (HOSPITAL_COMMUNITY)
Admission: EM | Admit: 2022-04-22 | Discharge: 2022-04-22 | Disposition: A | Discharge status: Home / Self Care | Payer: Self-pay | Attending: Emergency Medicine | Admitting: Emergency Medicine

## 2022-04-22 DIAGNOSIS — N3090 Cystitis, unspecified without hematuria: Secondary | ICD-10-CM

## 2022-04-22 LAB — URINALYSIS, ROUTINE W REFLEX MICROSCOPIC
Bilirubin Urine: NEGATIVE
Glucose, UA: NEGATIVE mg/dL
Ketones, ur: NEGATIVE mg/dL
Nitrite: POSITIVE — AB
Protein, ur: >=300 mg/dL — AB
RBC / HPF: >50 RBC/hpf — ABNORMAL HIGH (ref 0–5)
Specific Gravity, Urine: 1.026 (ref 1.005–1.030)
WBC, UA: >50 WBC/hpf — ABNORMAL HIGH (ref 0–5)
pH: 5 (ref 5.0–8.0)

## 2022-04-22 LAB — BASIC METABOLIC PANEL
Anion gap: 10 (ref 5–15)
BUN: 13 mg/dL (ref 6–20)
CO2: 23 mmol/L (ref 22–32)
Calcium: 9.4 mg/dL (ref 8.9–10.3)
Chloride: 106 mmol/L (ref 98–111)
Creatinine, Ser: 0.9 mg/dL (ref 0.44–1.00)
GFR, Estimated: >60 mL/min (ref >60)
Glucose, Bld: 114 mg/dL — ABNORMAL HIGH (ref 70–99)
Potassium: 3.7 mmol/L (ref 3.5–5.1)
Sodium: 139 mmol/L (ref 135–145)

## 2022-04-22 LAB — CBC
HCT: 45.2 % (ref 36.0–46.0)
Hemoglobin: 15.3 g/dL — ABNORMAL HIGH (ref 12.0–15.0)
MCH: 27.8 pg (ref 26.0–34.0)
MCHC: 33.8 g/dL (ref 30.0–36.0)
MCV: 82.2 fL (ref 80.0–100.0)
Platelets: 330 10*3/uL (ref 150–400)
RBC: 5.5 MIL/uL — ABNORMAL HIGH (ref 3.87–5.11)
RDW: 12.6 % (ref 11.5–15.5)
WBC: 8.7 10*3/uL (ref 4.0–10.5)
nRBC: 0 % (ref 0.0–0.2)

## 2022-04-22 MED ORDER — CEPHALEXIN 500 MG PO CAPS: 500.0000 mg | ORAL_CAPSULE | Freq: Four times a day (QID) | ORAL | 0 refills | Status: AC

## 2022-04-22 MED ORDER — CEFTRIAXONE SODIUM 1 G IJ SOLR
1.0000 g | Freq: Once | INTRAMUSCULAR | Status: AC
  Administered 2022-04-22: 1 g via INTRAMUSCULAR
  Filled 2022-04-22: qty 10

## 2022-04-22 NOTE — ED Provider Notes (Signed)
[facility] EMERGENCY DEPARTMENT Provider Note   CSN: [id number] Arrival date & time: 04/22/22  4680     History  Chief Complaint  Patient presents with   Dysuria    Katelyn Johnson is a 46 y.o. female.   Dysuria Patient presents for dysuria.  She has no known chronic medical conditions.  Recent history is as follows: She had a transient episode of nausea 4 days ago.  Starting yesterday, she experienced dysuria and frequency.  She also has noticed a orange color and a foul smell to her urine.  She states that she has not had a UTI in several years.  When she did have remote UTIs, she had similar symptoms.  She has recently had constipation.  Although she had a bowel movement yesterday, it was small and firm.  She has had some mild lower abdominal discomfort.  She denies any fevers, chills, or back pain.  She denies any vaginal discharge or irritation.     Home Medications Prior to Admission medications   Medication Sig Start Date End Date Taking? Authorizing Provider  cephALEXin (KEFLEX) 500 MG capsule Take 1 capsule (500 mg total) by mouth 4 (four) times daily for 5 days. 04/22/22 04/27/22 Yes Gloris Manchester, MD  amoxicillin (AMOXIL) 500 MG capsule Take 2 capsules (1,000 mg total) by mouth 2 (two) times daily. 01/05/14   Elpidio Anis, PA-C  benzonatate (TESSALON) 200 MG capsule Take 1 capsule (200 mg total) by mouth 3 (three) times daily as needed for cough. 01/05/14   Elpidio Anis, PA-C  cyclobenzaprine (FLEXERIL) 10 MG tablet Take 1 tablet (10 mg total) by mouth 3 (three) times daily as needed for muscle spasms. 04/19/13   Pollyann Savoy, MD  hydrOXYzine (ATARAX/VISTARIL) 25 MG tablet Take 1 tablet (25 mg total) by mouth once. 02/11/16   Narda Bonds, MD  oxyCODONE-acetaminophen (PERCOCET) 5-325 MG per tablet Take 1 tablet by mouth every 6 (six) hours as needed for pain. 07/29/13   Purvis Sheffield, MD  oxyCODONE-acetaminophen (ROXICET) 5-325 MG per tablet Take 1  tablet by mouth every 4 (four) hours as needed for severe pain. 06/26/14   Janne Napoleon, NP      Allergies    Sulfa antibiotics    Review of Systems   Review of Systems  Gastrointestinal:  Positive for constipation.  Genitourinary:  Positive for dysuria and frequency.  All other systems reviewed and are negative.   Physical Exam Updated Vital Signs BP (!) 115/97 (BP Location: Right Arm)   Pulse 87   Temp (!) 97.4 F (36.3 C) (Oral)   Resp 16   Ht 5\' 2"  (1.575 m)   Wt 54.4 kg   SpO2 100%   BMI 21.95 kg/m  Physical Exam Vitals and nursing note reviewed.  Constitutional:      General: She is not in acute distress.    Appearance: Normal appearance. She is well-developed and normal weight. She is not ill-appearing, toxic-appearing or diaphoretic.  HENT:     Head: Normocephalic and atraumatic.     Right Ear: External ear normal.     Left Ear: External ear normal.     Nose: Nose normal.     Mouth/Throat:     Mouth: Mucous membranes are moist.     Pharynx: Oropharynx is clear.  Eyes:     Extraocular Movements: Extraocular movements intact.     Conjunctiva/sclera: Conjunctivae normal.  Cardiovascular:     Rate and Rhythm: Normal rate and regular rhythm.  Heart sounds: No murmur heard. Pulmonary:     Effort: Pulmonary effort is normal. No respiratory distress.  Abdominal:     General: There is no distension.     Palpations: Abdomen is soft.     Tenderness: There is no abdominal tenderness. There is no right CVA tenderness or left CVA tenderness.  Musculoskeletal:        General: No swelling. Normal range of motion.     Cervical back: Neck supple.     Right lower leg: No edema.     Left lower leg: No edema.  Skin:    General: Skin is warm and dry.     Coloration: Skin is not jaundiced or pale.  Neurological:     General: No focal deficit present.     Mental Status: She is alert and oriented to person, place, and time.     Cranial Nerves: No cranial nerve deficit.      Sensory: No sensory deficit.     Motor: No weakness.     Coordination: Coordination normal.  Psychiatric:        Mood and Affect: Mood normal.        Behavior: Behavior normal.        Thought Content: Thought content normal.        Judgment: Judgment normal.     ED Results / Procedures / Treatments   Labs (all labs ordered are listed, but only abnormal results are displayed) Labs Reviewed  URINE CULTURE - Abnormal; Notable for the following components:      Result Value   Culture >=100,000 COLONIES/mL ESCHERICHIA COLI (*)    Organism ID, Bacteria ESCHERICHIA COLI (*)    All other components within normal limits  URINALYSIS, ROUTINE W REFLEX MICROSCOPIC - Abnormal; Notable for the following components:   Color, Urine AMBER (*)    APPearance CLOUDY (*)    Hgb urine dipstick MODERATE (*)    Protein, ur >=300 (*)    Nitrite POSITIVE (*)    Leukocytes,Ua MODERATE (*)    RBC / HPF >50 (*)    WBC, UA >50 (*)    Bacteria, UA MANY (*)    Non Squamous Epithelial 0-5 (*)    All other components within normal limits  BASIC METABOLIC PANEL - Abnormal; Notable for the following components:   Glucose, Bld 114 (*)    All other components within normal limits  CBC - Abnormal; Notable for the following components:   RBC 5.50 (*)    Hemoglobin 15.3 (*)    All other components within normal limits    EKG None  Radiology No results found.  Procedures Procedures    Medications Ordered in ED Medications  cefTRIAXone (ROCEPHIN) injection 1 g (1 g Intramuscular Given 04/22/22 1259)    ED Course/ Medical Decision Making/ A&P                           Medical Decision Making Amount and/or Complexity of Data Reviewed Labs: ordered.  Risk Prescription drug management.   This patient presents to the ED for concern of dysuria, this involves an extensive number of treatment options, and is a complaint that carries with it a high risk of complications and morbidity.  The  differential diagnosis includes cystitis, urethritis, STI, incomplete voiding   Co morbidities that complicate the patient evaluation  N/A   Additional history obtained:  Additional history obtained from N/A External records from outside source obtained  and reviewed including EMR   Lab Tests:  I Ordered, and personally interpreted labs.  The pertinent results include: No leukocytosis, normal electrolytes, normal kidney function, urinalysis showed UTI.  Problem List / ED Course / Critical interventions / Medication management  Patient is a healthy 46 year old female presenting for dysuria and urinary frequency since yesterday.  She is well-appearing on arrival.  Vital signs are normal.  Initial lab work shows no leukocytosis.  On exam, she does not have any abdominal or CVA tenderness.  Urinalysis shows UTI.  Patient is given dose of ceftriaxone in the ED.  She was prescribed additional 5 days of Keflex.  She was advised to return if symptoms do not improve despite antibiotic therapy.  Patient was discharged in good condition. I ordered medication including ceftriaxone for UTI Reevaluation of the patient after these medicines showed that the patient stayed the same I have reviewed the patients home medicines and have made adjustments as needed   Social Determinants of Health:  Does not have PCP         Final Clinical Impression(s) / ED Diagnoses Final diagnoses:  Cystitis    Rx / DC Orders ED Discharge Orders          Ordered    cephALEXin (KEFLEX) 500 MG capsule  4 times daily        04/22/22 1320              Gloris Manchester, MD 04/25/22 1228

## 2022-04-22 NOTE — ED Triage Notes (Signed)
Pt states she thinks she has a UTI. Pt says her urine is dark and has a odor. Pain with urination. She was having vomiting on Sunday, lower abdominal pain yesterday, but has since subsided. No fevers.

## 2022-04-22 NOTE — Discharge Instructions (Signed)
You have a urine infection.  You received your first dose of antibiotics in the emergency department.  This will cover you for the next 24 hours.  A prescription was sent to the [facility] in [place].  This is for continued antibiotics over the next 5 days.  Take this as prescribed.  Your symptoms should begin to improve.  If they do not, please return to the emergency department.

## 2022-04-24 LAB — URINE CULTURE: Culture: >=100000 — AB

## 2022-04-25 ENCOUNTER — Telehealth (HOSPITAL_BASED_OUTPATIENT_CLINIC_OR_DEPARTMENT_OTHER): Payer: Self-pay | Admitting: *Deleted

## 2022-04-25 NOTE — Telephone Encounter (Signed)
Post ED Visit - Positive Culture Follow-up  Culture report reviewed by antimicrobial stewardship pharmacist: [facility] []  , Pharm.D. []  Enzo Bi, Pharm.D., BCPS AQ-ID []  , Pharm.D., BCPS []  Celedonio Miyamoto, .D., BCPS []  [place], .D., BCPS, AAHIVP []  Georgina Pillion, Pharm.D., BCPS, AAHIVP []  [address], PharmD, BCPS []  , PharmD, BCPS []  [place], PharmD, BCPS []  [place], PharmD []  , PharmD, BCPS [x]  Estella Husk, PharmD  Pharmacy Team []  Lysle Pearl, PharmD []  , PharmD []  Phillips Climes, PharmD []  , Rph []  Agapito Games) , PharmD []  Verlan Friends, PharmD []  , PharmD []  Mervyn Gay, PharmD []  , PharmD []  Delmar Landau, PharmD []  [facility], PharmD []  , PharmD []  Len Childs, PharmD   Positive urine culture Treated with Cephalexin, organism sensitive to the same and no further patient follow-up is required at this time.  04/25/2022, 11:17 AM
